# Patient Record
Sex: Female | Born: 1975 | Race: White | Hispanic: No | Marital: Married | Smoking: Never smoker
Health system: Southern US, Community
[De-identification: ages and names within clinical notes are randomized; demographics above are authoritative.]

## PROBLEM LIST (undated history)

## (undated) DIAGNOSIS — Z789 Other specified health status: Secondary | ICD-10-CM

## (undated) DIAGNOSIS — R87619 Unspecified abnormal cytological findings in specimens from cervix uteri: Secondary | ICD-10-CM

## (undated) DIAGNOSIS — D649 Anemia, unspecified: Secondary | ICD-10-CM

## (undated) HISTORY — DX: Unspecified abnormal cytological findings in specimens from cervix uteri: R87.619

## (undated) HISTORY — DX: Anemia, unspecified: D64.9

---

## 1999-05-18 ENCOUNTER — Other Ambulatory Visit: Admission: RE | Admit: 1999-05-18 | Discharge: 1999-05-18 | Payer: Self-pay | Admitting: Obstetrics and Gynecology

## 2000-05-09 ENCOUNTER — Other Ambulatory Visit: Admission: RE | Admit: 2000-05-09 | Discharge: 2000-05-09 | Payer: Self-pay | Admitting: Obstetrics and Gynecology

## 2001-04-10 ENCOUNTER — Other Ambulatory Visit: Admission: RE | Admit: 2001-04-10 | Discharge: 2001-04-10 | Payer: Self-pay | Admitting: Obstetrics and Gynecology

## 2001-11-26 ENCOUNTER — Other Ambulatory Visit: Admission: RE | Admit: 2001-11-26 | Discharge: 2001-11-26 | Payer: Self-pay | Admitting: *Deleted

## 2002-05-23 ENCOUNTER — Other Ambulatory Visit: Admission: RE | Admit: 2002-05-23 | Discharge: 2002-05-23 | Payer: Self-pay | Admitting: Unknown Physician Specialty

## 2002-08-28 ENCOUNTER — Encounter: Payer: Self-pay | Admitting: Gastroenterology

## 2002-08-28 ENCOUNTER — Encounter: Admission: RE | Admit: 2002-08-28 | Discharge: 2002-08-28 | Payer: Self-pay | Admitting: Gastroenterology

## 2003-03-12 ENCOUNTER — Other Ambulatory Visit: Admission: RE | Admit: 2003-03-12 | Discharge: 2003-03-12 | Payer: Self-pay | Admitting: Obstetrics and Gynecology

## 2003-09-04 ENCOUNTER — Other Ambulatory Visit: Admission: RE | Admit: 2003-09-04 | Discharge: 2003-09-04 | Payer: Self-pay | Admitting: *Deleted

## 2003-09-04 ENCOUNTER — Other Ambulatory Visit: Admission: RE | Admit: 2003-09-04 | Discharge: 2003-09-04 | Payer: Self-pay | Admitting: Obstetrics and Gynecology

## 2004-09-27 ENCOUNTER — Other Ambulatory Visit: Admission: RE | Admit: 2004-09-27 | Discharge: 2004-09-27 | Payer: Self-pay | Admitting: Obstetrics and Gynecology

## 2006-02-21 ENCOUNTER — Other Ambulatory Visit: Admission: RE | Admit: 2006-02-21 | Discharge: 2006-02-21 | Payer: Self-pay | Admitting: Obstetrics and Gynecology

## 2007-04-06 ENCOUNTER — Other Ambulatory Visit: Admission: RE | Admit: 2007-04-06 | Discharge: 2007-04-06 | Payer: Self-pay | Admitting: Obstetrics & Gynecology

## 2007-04-06 ENCOUNTER — Other Ambulatory Visit: Admission: RE | Admit: 2007-04-06 | Discharge: 2007-04-06 | Payer: Self-pay | Admitting: Obstetrics and Gynecology

## 2007-08-03 ENCOUNTER — Other Ambulatory Visit: Admission: RE | Admit: 2007-08-03 | Discharge: 2007-08-03 | Payer: Self-pay | Admitting: Obstetrics and Gynecology

## 2007-12-28 ENCOUNTER — Other Ambulatory Visit: Admission: RE | Admit: 2007-12-28 | Discharge: 2007-12-28 | Payer: Self-pay | Admitting: Obstetrics and Gynecology

## 2008-04-23 ENCOUNTER — Other Ambulatory Visit: Admission: RE | Admit: 2008-04-23 | Discharge: 2008-04-23 | Payer: Self-pay | Admitting: Obstetrics and Gynecology

## 2009-09-12 ENCOUNTER — Emergency Department (HOSPITAL_COMMUNITY): Admission: EM | Admit: 2009-09-12 | Discharge: 2009-09-12 | Payer: Self-pay | Admitting: Family Medicine

## 2010-11-05 ENCOUNTER — Inpatient Hospital Stay (HOSPITAL_COMMUNITY): Admission: AD | Admit: 2010-11-05 | Payer: Self-pay | Admitting: Obstetrics and Gynecology

## 2010-11-08 ENCOUNTER — Inpatient Hospital Stay (HOSPITAL_COMMUNITY)
Admission: AD | Admit: 2010-11-08 | Discharge: 2010-11-11 | DRG: 373 | Disposition: A | Discharge status: Home / Self Care | Payer: BC Managed Care – PPO | Source: Ambulatory Visit | Attending: Obstetrics and Gynecology | Admitting: Obstetrics and Gynecology

## 2010-11-08 LAB — CBC
HCT: 33.1 % — ABNORMAL LOW (ref 36.0–46.0)
Hemoglobin: 11.2 g/dL — ABNORMAL LOW (ref 12.0–15.0)
MCH: 30.2 pg (ref 26.0–34.0)
MCHC: 33.8 g/dL (ref 30.0–36.0)
MCV: 89.2 fL (ref 78.0–100.0)
Platelets: 200 10*3/uL (ref 150–400)
RBC: 3.71 MIL/uL — ABNORMAL LOW (ref 3.87–5.11)
RDW: 12.7 % (ref 11.5–15.5)
WBC: 7.2 10*3/uL (ref 4.0–10.5)

## 2010-11-09 LAB — RPR: RPR Ser Ql: NONREACTIVE

## 2010-11-10 LAB — CBC
HCT: 26.6 % — ABNORMAL LOW (ref 36.0–46.0)
Hemoglobin: 8.9 g/dL — ABNORMAL LOW (ref 12.0–15.0)
MCH: 30 pg (ref 26.0–34.0)
MCHC: 33.5 g/dL (ref 30.0–36.0)
MCV: 89.6 fL (ref 78.0–100.0)
Platelets: 155 10*3/uL (ref 150–400)
RBC: 2.97 MIL/uL — ABNORMAL LOW (ref 3.87–5.11)
RDW: 12.7 % (ref 11.5–15.5)
WBC: 9.3 10*3/uL (ref 4.0–10.5)

## 2012-01-05 LAB — OB RESULTS CONSOLE HIV ANTIBODY (ROUTINE TESTING): HIV: NONREACTIVE

## 2012-01-05 LAB — OB RESULTS CONSOLE RPR: RPR: NONREACTIVE

## 2012-01-17 LAB — OB RESULTS CONSOLE GC/CHLAMYDIA
Chlamydia: NEGATIVE
Gonorrhea: NEGATIVE

## 2012-07-10 LAB — OB RESULTS CONSOLE GBS: GBS: NEGATIVE

## 2012-08-06 ENCOUNTER — Encounter (HOSPITAL_COMMUNITY): Payer: Self-pay | Admitting: *Deleted

## 2012-08-06 ENCOUNTER — Inpatient Hospital Stay (HOSPITAL_COMMUNITY)
Admission: AD | Admit: 2012-08-06 | Discharge: 2012-08-09 | DRG: 371 | Disposition: A | Discharge status: Home / Self Care | Payer: BC Managed Care – PPO | Source: Ambulatory Visit | Attending: Obstetrics and Gynecology | Admitting: Obstetrics and Gynecology

## 2012-08-06 DIAGNOSIS — O09529 Supervision of elderly multigravida, unspecified trimester: Secondary | ICD-10-CM | POA: Diagnosis present

## 2012-08-06 DIAGNOSIS — Z98891 History of uterine scar from previous surgery: Secondary | ICD-10-CM

## 2012-08-06 HISTORY — DX: Other specified health status: Z78.9

## 2012-08-06 LAB — CBC
HCT: 35.5 % — ABNORMAL LOW (ref 36.0–46.0)
Hemoglobin: 12.3 g/dL (ref 12.0–15.0)
WBC: 9.5 10*3/uL (ref 4.0–10.5)

## 2012-08-06 MED ORDER — FENTANYL 2.5 MCG/ML BUPIVACAINE 1/10 % EPIDURAL INFUSION (WH - ANES)
14.0000 mL/h | INTRAMUSCULAR | Status: DC
  Administered 2012-08-07: 14 mL/h via EPIDURAL
  Filled 2012-08-06: qty 125

## 2012-08-06 MED ORDER — LACTATED RINGERS IV SOLN
INTRAVENOUS | Status: DC
  Administered 2012-08-06: via INTRAVENOUS

## 2012-08-06 MED ORDER — ACETAMINOPHEN 325 MG PO TABS: 650.0000 mg | ORAL_TABLET | ORAL | Status: DC | PRN

## 2012-08-06 MED ORDER — OXYTOCIN 40 UNITS IN LACTATED RINGERS INFUSION - SIMPLE MED: 62.5000 mL/h | INTRAVENOUS | Status: DC

## 2012-08-06 MED ORDER — FLEET ENEMA 7-19 GM/118ML RE ENEM: 1.0000 | ENEMA | RECTAL | Status: DC | PRN

## 2012-08-06 MED ORDER — OXYCODONE-ACETAMINOPHEN 5-325 MG PO TABS: 1.0000 | ORAL_TABLET | ORAL | Status: DC | PRN

## 2012-08-06 MED ORDER — OXYTOCIN BOLUS FROM INFUSION: 500.0000 mL | INTRAVENOUS | Status: DC

## 2012-08-06 MED ORDER — ONDANSETRON HCL 4 MG/2ML IJ SOLN: 4.0000 mg | Freq: Four times a day (QID) | INTRAMUSCULAR | Status: DC | PRN

## 2012-08-06 MED ORDER — EPHEDRINE 5 MG/ML INJ: 10.0000 mg | INTRAVENOUS | Status: DC | PRN

## 2012-08-06 MED ORDER — LACTATED RINGERS IV SOLN: 500.0000 mL | Freq: Once | INTRAVENOUS | Status: DC

## 2012-08-06 MED ORDER — IBUPROFEN 600 MG PO TABS: 600.0000 mg | ORAL_TABLET | Freq: Four times a day (QID) | ORAL | Status: DC | PRN

## 2012-08-06 MED ORDER — PHENYLEPHRINE 40 MCG/ML (10ML) SYRINGE FOR IV PUSH (FOR BLOOD PRESSURE SUPPORT): 80.0000 ug | PREFILLED_SYRINGE | INTRAVENOUS | Status: DC | PRN

## 2012-08-06 MED ORDER — CITRIC ACID-SODIUM CITRATE 334-500 MG/5ML PO SOLN
30.0000 mL | ORAL | Status: DC | PRN
  Filled 2012-08-06: qty 15

## 2012-08-06 MED ORDER — DIPHENHYDRAMINE HCL 50 MG/ML IJ SOLN: 12.5000 mg | INTRAMUSCULAR | Status: DC | PRN

## 2012-08-06 MED ORDER — LACTATED RINGERS IV SOLN
500.0000 mL | INTRAVENOUS | Status: DC | PRN
  Administered 2012-08-06: 500 mL via INTRAVENOUS

## 2012-08-06 MED ORDER — TERBUTALINE SULFATE 1 MG/ML IJ SOLN: 0.2500 mg | Freq: Once | INTRAMUSCULAR | Status: AC | PRN

## 2012-08-06 MED ORDER — LIDOCAINE HCL (PF) 1 % IJ SOLN
30.0000 mL | INTRAMUSCULAR | Status: DC | PRN
  Filled 2012-08-06: qty 30

## 2012-08-06 MED ORDER — OXYTOCIN 40 UNITS IN LACTATED RINGERS INFUSION - SIMPLE MED
1.0000 m[IU]/min | INTRAVENOUS | Status: DC
  Administered 2012-08-07: 2 m[IU]/min via INTRAVENOUS
  Filled 2012-08-06: qty 1000

## 2012-08-06 NOTE — Anesthesia Procedure Notes (Signed)
Epidural Patient location during procedure: OB Start time: 08/06/2012 11:50 PM  Staffing Performed by: anesthesiologist   Preanesthetic Checklist Completed: patient identified, site marked, surgical consent, pre-op evaluation, timeout performed, IV checked, risks and benefits discussed and monitors and equipment checked  Epidural Patient position: sitting Prep: site prepped and draped and DuraPrep Patient monitoring: continuous pulse ox and blood pressure Approach: midline Injection technique: LOR air  Needle:  Needle type: Tuohy  Needle gauge: 17 G Needle length: 9 cm and 9 Needle insertion depth: 5 cm cm Catheter type: closed end flexible Catheter size: 19 Gauge Catheter at skin depth: 10 cm Test dose: negative  Assessment Events: blood not aspirated, injection not painful, no injection resistance, negative IV test and no paresthesia  Additional Notes Discussed risk of headache, infection, bleeding, nerve injury and failed or incomplete block.  Patient voices understanding and wishes to proceed.  Epidural placed easily on first attempt.  Patient tolerated procedure well with no apparent complications.  Jasmine December, MD Reason for block:procedure for pain

## 2012-08-06 NOTE — Progress Notes (Signed)
Strong fetal movement palpated and seen. Abdomen shape changed dramatically.  Pt turned side to side.

## 2012-08-06 NOTE — Anesthesia Preprocedure Evaluation (Addendum)
Anesthesia Evaluation  Patient identified by MRN, date of birth, ID band Patient awake    Reviewed: Allergy & Precautions, H&P , NPO status , Patient's Chart, lab work & pertinent test results, reviewed documented beta blocker date and time   History of Anesthesia Complications Negative for: history of anesthetic complications  Airway Mallampati: I TM Distance: >3 FB Neck ROM: full    Dental  (+) Teeth Intact   Pulmonary neg pulmonary ROS,  breath sounds clear to auscultation        Cardiovascular negative cardio ROS  Rhythm:regular Rate:Normal     Neuro/Psych negative neurological ROS  negative psych ROS   GI/Hepatic negative GI ROS, Neg liver ROS,   Endo/Other  negative endocrine ROS  Renal/GU negative Renal ROS     Musculoskeletal   Abdominal   Peds  Hematology negative hematology ROS (+)   Anesthesia Other Findings   Reproductive/Obstetrics (+) Pregnancy (fetal bradycardia)                          Anesthesia Physical Anesthesia Plan  ASA: II and emergent  Anesthesia Plan: Epidural   Post-op Pain Management:    Induction:   Airway Management Planned:   Additional Equipment:   Intra-op Plan:   Post-operative Plan:   Informed Consent: I have reviewed the patients History and Physical, chart, labs and discussed the procedure including the risks, benefits and alternatives for the proposed anesthesia with the patient or authorized representative who has indicated his/her understanding and acceptance.     Plan Discussed with:   Anesthesia Plan Comments:        Anesthesia Quick Evaluation

## 2012-08-06 NOTE — Progress Notes (Signed)
EFM off for pt to walk.

## 2012-08-06 NOTE — Progress Notes (Signed)
Patient ID: Stacey Dodson, female   DOB: 1976/06/05, 36 y.o.   MRN: [id number] Cervix  4 cm 50 % vtx -1 Increasing ctx's  fhr reactive no decels Declines pitocin at this point will follow

## 2012-08-06 NOTE — H&P (Signed)
Stacey Dodson is a 36 y.o. female presenting at 68.6 with spontaneous rupture of membranes.  Negative GBS Maternal Medical History:  Reason for admission: Reason for admission: rupture of membranes.  Contractions: Onset was 1-2 hours ago.   Frequency: regular.   Perceived severity is moderate.    Fetal activity: Perceived fetal activity is normal.    Prenatal complications: no prenatal complications Prenatal Complications - Diabetes: none.    OB History    No data available     No past medical history on file. No past surgical history on file. Family History: family history is not on file. Social History:  does not have a smoking history on file. She does not have any smokeless tobacco history on file. Her alcohol and drug histories not on file.   Prenatal Transfer Tool  Maternal Diabetes: No Genetic Screening: Normal Maternal Ultrasounds/Referrals: Normal Fetal Ultrasounds or other Referrals:  None Maternal Substance Abuse:  No Significant Maternal Medications:  None Significant Maternal Lab Results:  None Other Comments:  None  ROS    There were no vitals taken for this visit. Maternal Exam:  Uterine Assessment: Contraction strength is moderate.  Contraction frequency is regular.   Abdomen: Patient reports no abdominal tenderness. Fundal height is c/w dates.   Estimated fetal weight is 7.5.   Fetal presentation: vertex  Cervix: Cervix 2-3 cm and 50 % effaced  vtx -2   Physical Exam  Prenatal labs: ABO, Rh:   Antibody:   Rubella:   RPR:    HBsAg:    HIV:    GBS:     Assessment/Plan: IUP at term with SROM Routine Labor and Delivery   Jazion Atteberry S 08/06/2012, 12:47 PM

## 2012-08-07 ENCOUNTER — Inpatient Hospital Stay (HOSPITAL_COMMUNITY): Payer: BC Managed Care – PPO | Admitting: Anesthesiology

## 2012-08-07 ENCOUNTER — Encounter (HOSPITAL_COMMUNITY)
Admission: AD | Disposition: A | Discharge status: Home / Self Care | Payer: Self-pay | Source: Ambulatory Visit | Attending: Obstetrics and Gynecology

## 2012-08-07 ENCOUNTER — Inpatient Hospital Stay (HOSPITAL_COMMUNITY): Payer: BC Managed Care – PPO

## 2012-08-07 ENCOUNTER — Encounter (HOSPITAL_COMMUNITY): Payer: Self-pay | Admitting: *Deleted

## 2012-08-07 ENCOUNTER — Encounter (HOSPITAL_COMMUNITY): Payer: Self-pay | Admitting: Anesthesiology

## 2012-08-07 SURGERY — Surgical Case
Anesthesia: Epidural | Site: Abdomen | Wound class: Clean Contaminated

## 2012-08-07 MED ORDER — EPHEDRINE SULFATE 50 MG/ML IJ SOLN
INTRAMUSCULAR | Status: DC | PRN
  Administered 2012-08-07: 15 mg via INTRAVENOUS
  Administered 2012-08-07 (×2): 10 mg via INTRAVENOUS

## 2012-08-07 MED ORDER — NALBUPHINE HCL 10 MG/ML IJ SOLN
5.0000 mg | INTRAMUSCULAR | Status: DC | PRN
  Filled 2012-08-07: qty 1

## 2012-08-07 MED ORDER — SODIUM CHLORIDE 0.9 % IJ SOLN: 3.0000 mL | INTRAMUSCULAR | Status: DC | PRN

## 2012-08-07 MED ORDER — LIDOCAINE-EPINEPHRINE (PF) 2 %-1:200000 IJ SOLN
INTRAMUSCULAR | Status: AC
  Filled 2012-08-07: qty 20

## 2012-08-07 MED ORDER — WITCH HAZEL-GLYCERIN EX PADS: 1.0000 "application " | MEDICATED_PAD | CUTANEOUS | Status: DC | PRN

## 2012-08-07 MED ORDER — LACTATED RINGERS IV SOLN: INTRAVENOUS | Status: DC

## 2012-08-07 MED ORDER — MORPHINE SULFATE (PF) 0.5 MG/ML IJ SOLN
INTRAMUSCULAR | Status: DC | PRN
  Administered 2012-08-07: 1 mg via INTRAVENOUS

## 2012-08-07 MED ORDER — ONDANSETRON HCL 4 MG/2ML IJ SOLN: 4.0000 mg | INTRAMUSCULAR | Status: DC | PRN

## 2012-08-07 MED ORDER — PRENATAL MULTIVITAMIN CH
1.0000 | ORAL_TABLET | Freq: Every day | ORAL | Status: DC
  Administered 2012-08-07 – 2012-08-09 (×3): 1 via ORAL
  Filled 2012-08-07 (×3): qty 1

## 2012-08-07 MED ORDER — DIPHENHYDRAMINE HCL 25 MG PO CAPS: 25.0000 mg | ORAL_CAPSULE | Freq: Four times a day (QID) | ORAL | Status: DC | PRN

## 2012-08-07 MED ORDER — OXYTOCIN 10 UNIT/ML IJ SOLN
40.0000 [IU] | INTRAVENOUS | Status: DC | PRN
  Administered 2012-08-07: 40 [IU] via INTRAVENOUS

## 2012-08-07 MED ORDER — DEXAMETHASONE SODIUM PHOSPHATE 10 MG/ML IJ SOLN
INTRAMUSCULAR | Status: AC
  Filled 2012-08-07: qty 1

## 2012-08-07 MED ORDER — MENTHOL 3 MG MT LOZG: 1.0000 | LOZENGE | OROMUCOSAL | Status: DC | PRN

## 2012-08-07 MED ORDER — SENNOSIDES-DOCUSATE SODIUM 8.6-50 MG PO TABS
2.0000 | ORAL_TABLET | Freq: Every day | ORAL | Status: DC
  Administered 2012-08-07 – 2012-08-08 (×2): 2 via ORAL

## 2012-08-07 MED ORDER — EPHEDRINE 5 MG/ML INJ
INTRAVENOUS | Status: AC
  Filled 2012-08-07: qty 10

## 2012-08-07 MED ORDER — DEXAMETHASONE SODIUM PHOSPHATE 10 MG/ML IJ SOLN
INTRAMUSCULAR | Status: DC | PRN
  Administered 2012-08-07: 10 mg via INTRAVENOUS

## 2012-08-07 MED ORDER — MORPHINE SULFATE (PF) 0.5 MG/ML IJ SOLN
INTRAMUSCULAR | Status: DC | PRN
  Administered 2012-08-07: 4 mg via EPIDURAL

## 2012-08-07 MED ORDER — SODIUM BICARBONATE 8.4 % IV SOLN
INTRAVENOUS | Status: DC | PRN
  Administered 2012-08-07 (×2): 5 mL via EPIDURAL

## 2012-08-07 MED ORDER — SODIUM CHLORIDE 0.9 % IV SOLN: 250.0000 mL | INTRAVENOUS | Status: DC

## 2012-08-07 MED ORDER — SIMETHICONE 80 MG PO CHEW: 80.0000 mg | CHEWABLE_TABLET | ORAL | Status: DC | PRN

## 2012-08-07 MED ORDER — MORPHINE SULFATE 0.5 MG/ML IJ SOLN
INTRAMUSCULAR | Status: AC
  Filled 2012-08-07: qty 10

## 2012-08-07 MED ORDER — TETANUS-DIPHTH-ACELL PERTUSSIS 5-2.5-18.5 LF-MCG/0.5 IM SUSP: 0.5000 mL | Freq: Once | INTRAMUSCULAR | Status: DC

## 2012-08-07 MED ORDER — ONDANSETRON HCL 4 MG PO TABS: 4.0000 mg | ORAL_TABLET | ORAL | Status: DC | PRN

## 2012-08-07 MED ORDER — KETOROLAC TROMETHAMINE 60 MG/2ML IM SOLN
60.0000 mg | Freq: Once | INTRAMUSCULAR | Status: AC | PRN
  Administered 2012-08-07: 60 mg via INTRAMUSCULAR

## 2012-08-07 MED ORDER — OXYCODONE-ACETAMINOPHEN 5-325 MG PO TABS: 1.0000 | ORAL_TABLET | ORAL | Status: DC | PRN

## 2012-08-07 MED ORDER — ONDANSETRON HCL 4 MG/2ML IJ SOLN
INTRAMUSCULAR | Status: DC | PRN
  Administered 2012-08-07: 4 mg via INTRAVENOUS

## 2012-08-07 MED ORDER — ONDANSETRON HCL 4 MG/2ML IJ SOLN
INTRAMUSCULAR | Status: AC
  Filled 2012-08-07: qty 2

## 2012-08-07 MED ORDER — DIPHENHYDRAMINE HCL 25 MG PO CAPS: 25.0000 mg | ORAL_CAPSULE | ORAL | Status: DC | PRN

## 2012-08-07 MED ORDER — MEPERIDINE HCL 25 MG/ML IJ SOLN: 6.2500 mg | INTRAMUSCULAR | Status: DC | PRN

## 2012-08-07 MED ORDER — IBUPROFEN 600 MG PO TABS
600.0000 mg | ORAL_TABLET | Freq: Four times a day (QID) | ORAL | Status: DC | PRN
  Filled 2012-08-07 (×6): qty 1

## 2012-08-07 MED ORDER — SODIUM BICARBONATE 8.4 % IV SOLN
INTRAVENOUS | Status: AC
  Filled 2012-08-07: qty 50

## 2012-08-07 MED ORDER — ZOLPIDEM TARTRATE 5 MG PO TABS: 5.0000 mg | ORAL_TABLET | Freq: Every evening | ORAL | Status: DC | PRN

## 2012-08-07 MED ORDER — OXYTOCIN 10 UNIT/ML IJ SOLN
INTRAMUSCULAR | Status: AC
  Filled 2012-08-07: qty 4

## 2012-08-07 MED ORDER — BISACODYL 10 MG RE SUPP: 10.0000 mg | Freq: Every day | RECTAL | Status: DC | PRN

## 2012-08-07 MED ORDER — METOCLOPRAMIDE HCL 5 MG/ML IJ SOLN: 10.0000 mg | Freq: Three times a day (TID) | INTRAMUSCULAR | Status: DC | PRN

## 2012-08-07 MED ORDER — ONDANSETRON HCL 4 MG/2ML IJ SOLN: 4.0000 mg | Freq: Three times a day (TID) | INTRAMUSCULAR | Status: DC | PRN

## 2012-08-07 MED ORDER — LIDOCAINE HCL (PF) 1 % IJ SOLN
INTRAMUSCULAR | Status: DC | PRN
  Administered 2012-08-06 – 2012-08-07 (×4): 4 mL

## 2012-08-07 MED ORDER — DIPHENHYDRAMINE HCL 50 MG/ML IJ SOLN: 12.5000 mg | INTRAMUSCULAR | Status: DC | PRN

## 2012-08-07 MED ORDER — NALOXONE HCL 1 MG/ML IJ SOLN: 1.0000 ug/kg/h | INTRAVENOUS | Status: DC | PRN

## 2012-08-07 MED ORDER — KETOROLAC TROMETHAMINE 60 MG/2ML IM SOLN
INTRAMUSCULAR | Status: AC
  Filled 2012-08-07: qty 2

## 2012-08-07 MED ORDER — DIBUCAINE 1 % RE OINT: 1.0000 "application " | TOPICAL_OINTMENT | RECTAL | Status: DC | PRN

## 2012-08-07 MED ORDER — CLINDAMYCIN PHOSPHATE 900 MG/50ML IV SOLN
900.0000 mg | Freq: Once | INTRAVENOUS | Status: AC
  Administered 2012-08-07: 900 mg via INTRAVENOUS
  Filled 2012-08-07: qty 50

## 2012-08-07 MED ORDER — OXYTOCIN 40 UNITS IN LACTATED RINGERS INFUSION - SIMPLE MED: 62.5000 mL/h | INTRAVENOUS | Status: AC

## 2012-08-07 MED ORDER — LACTATED RINGERS IV SOLN
INTRAVENOUS | Status: DC | PRN
  Administered 2012-08-07: 03:00:00 via INTRAVENOUS

## 2012-08-07 MED ORDER — DIPHENHYDRAMINE HCL 50 MG/ML IJ SOLN: 25.0000 mg | INTRAMUSCULAR | Status: DC | PRN

## 2012-08-07 MED ORDER — PHENYLEPHRINE 40 MCG/ML (10ML) SYRINGE FOR IV PUSH (FOR BLOOD PRESSURE SUPPORT)
PREFILLED_SYRINGE | INTRAVENOUS | Status: AC
  Filled 2012-08-07: qty 5

## 2012-08-07 MED ORDER — SCOPOLAMINE 1 MG/3DAYS TD PT72
1.0000 | MEDICATED_PATCH | Freq: Once | TRANSDERMAL | Status: DC
  Administered 2012-08-07: 1.5 mg via TRANSDERMAL

## 2012-08-07 MED ORDER — FENTANYL CITRATE 0.05 MG/ML IJ SOLN: 25.0000 ug | INTRAMUSCULAR | Status: DC | PRN

## 2012-08-07 MED ORDER — LANOLIN HYDROUS EX OINT: 1.0000 "application " | TOPICAL_OINTMENT | CUTANEOUS | Status: DC | PRN

## 2012-08-07 MED ORDER — NALOXONE HCL 0.4 MG/ML IJ SOLN: 0.4000 mg | INTRAMUSCULAR | Status: DC | PRN

## 2012-08-07 MED ORDER — 0.9 % SODIUM CHLORIDE (POUR BTL) OPTIME
TOPICAL | Status: DC | PRN
  Administered 2012-08-07: 500 mL

## 2012-08-07 MED ORDER — SODIUM CHLORIDE 0.9 % IJ SOLN: 3.0000 mL | Freq: Two times a day (BID) | INTRAMUSCULAR | Status: DC

## 2012-08-07 MED ORDER — LACTATED RINGERS IV SOLN
INTRAVENOUS | Status: DC | PRN
  Administered 2012-08-07 (×3): via INTRAVENOUS

## 2012-08-07 MED ORDER — KETOROLAC TROMETHAMINE 30 MG/ML IJ SOLN: 30.0000 mg | Freq: Four times a day (QID) | INTRAMUSCULAR | Status: AC | PRN

## 2012-08-07 MED ORDER — FLEET ENEMA 7-19 GM/118ML RE ENEM: 1.0000 | ENEMA | Freq: Every day | RECTAL | Status: DC | PRN

## 2012-08-07 MED ORDER — SCOPOLAMINE 1 MG/3DAYS TD PT72
MEDICATED_PATCH | TRANSDERMAL | Status: AC
  Filled 2012-08-07: qty 1

## 2012-08-07 MED ORDER — PHENYLEPHRINE HCL 10 MG/ML IJ SOLN
INTRAMUSCULAR | Status: DC | PRN
  Administered 2012-08-07: 40 ug via INTRAVENOUS
  Administered 2012-08-07 (×4): 80 ug via INTRAVENOUS

## 2012-08-07 MED ORDER — SIMETHICONE 80 MG PO CHEW
80.0000 mg | CHEWABLE_TABLET | Freq: Three times a day (TID) | ORAL | Status: DC
  Administered 2012-08-07 – 2012-08-09 (×7): 80 mg via ORAL

## 2012-08-07 MED ORDER — IBUPROFEN 600 MG PO TABS
600.0000 mg | ORAL_TABLET | Freq: Four times a day (QID) | ORAL | Status: DC
  Administered 2012-08-07 – 2012-08-09 (×8): 600 mg via ORAL
  Filled 2012-08-07 (×2): qty 1

## 2012-08-07 SURGICAL SUPPLY — 32 items
CLOTH BEACON ORANGE TIMEOUT ST (SAFETY) ×2 IMPLANT
DRAPE LG THREE QUARTER DISP (DRAPES) ×2 IMPLANT
DRESSING TELFA 8X3 (GAUZE/BANDAGES/DRESSINGS) IMPLANT
DRSG OPSITE POSTOP 4X10 (GAUZE/BANDAGES/DRESSINGS) ×2 IMPLANT
DURAPREP 26ML APPLICATOR (WOUND CARE) ×1 IMPLANT
ELECT REM PT RETURN 9FT ADLT (ELECTROSURGICAL) ×2
ELECTRODE REM PT RTRN 9FT ADLT (ELECTROSURGICAL) ×1 IMPLANT
EXTRACTOR VACUUM M CUP 4 TUBE (SUCTIONS) IMPLANT
GAUZE SPONGE 4X4 12PLY STRL LF (GAUZE/BANDAGES/DRESSINGS) ×3 IMPLANT
GLOVE BIO SURGEON STRL SZ7 (GLOVE) ×5 IMPLANT
GOWN STRL REIN XL XLG (GOWN DISPOSABLE) ×4 IMPLANT
KIT ABG SYR 3ML LUER SLIP (SYRINGE) ×2 IMPLANT
NDL HYPO 25X5/8 SAFETYGLIDE (NEEDLE) ×1 IMPLANT
NEEDLE HYPO 25X5/8 SAFETYGLIDE (NEEDLE) ×2 IMPLANT
NS IRRIG 1000ML POUR BTL (IV SOLUTION) ×2 IMPLANT
PACK C SECTION WH (CUSTOM PROCEDURE TRAY) ×2 IMPLANT
PAD ABD 7.5X8 STRL (GAUZE/BANDAGES/DRESSINGS) IMPLANT
PAD OB MATERNITY 4.3X12.25 (PERSONAL CARE ITEMS) ×1 IMPLANT
SLEEVE SCD COMPRESS KNEE MED (MISCELLANEOUS) ×2 IMPLANT
STAPLER VISISTAT 35W (STAPLE) ×2 IMPLANT
STRIP CLOSURE SKIN 1/4X4 (GAUZE/BANDAGES/DRESSINGS) IMPLANT
SUT CHROMIC 0 CT 1 (SUTURE) IMPLANT
SUT CHROMIC 0 CTX 36 (SUTURE) ×4 IMPLANT
SUT CHROMIC 2 0 SH (SUTURE) IMPLANT
SUT PDS AB 0 CT 36 (SUTURE) ×2 IMPLANT
SUT PLAIN 0 NONE (SUTURE) IMPLANT
SUT PLAIN 2 0 XLH (SUTURE) IMPLANT
SUT VIC AB 3-0 CT1 27 (SUTURE) ×2
SUT VIC AB 3-0 CT1 TAPERPNT 27 (SUTURE) ×1 IMPLANT
TOWEL OR 17X24 6PK STRL BLUE (TOWEL DISPOSABLE) ×6 IMPLANT
TRAY FOLEY CATH 14FR (SET/KITS/TRAYS/PACK) ×2 IMPLANT
WATER STERILE IRR 1000ML POUR (IV SOLUTION) ×2 IMPLANT

## 2012-08-07 NOTE — Brief Op Note (Signed)
08/06/2012 - 08/07/2012  3:47 AM  PATIENT:  Stacey Dodson  36 y.o. female  PRE-OPERATIVE DIAGNOSIS:  fetal bradycardia  POST-OPERATIVE DIAGNOSIS:  fetal bradycardia  PROCEDURE:  Procedure(s) (LRB) with comments: CESAREAN SECTION (N/A) - Primary cesarean section of baby  boy at 5  APGAR 6/9  SURGEON:  Surgeon(s) and Role:    * Juluis Mire, MD - Primary  PHYSICIAN ASSISTANT:   ASSISTANTS: none   ANESTHESIA:   epidural  EBL:  Total I/O In: -  Out: 950 [Urine:450; Blood:500]  BLOOD ADMINISTERED:none  DRAINS: Urinary Catheter (Foley)   LOCAL MEDICATIONS USED:  NONE  SPECIMEN:  Source of Specimen:  placenta  DISPOSITION OF SPECIMEN:  PATHOLOGY  COUNTS:  NO no count due to stat cesarean section x-ray will be obtained  TOURNIQUET:  * No tourniquets in log *  DICTATION: .Other Dictation: Dictation Number [id number]  PLAN OF CARE: Admit to inpatient   PATIENT DISPOSITION:  PACU - hemodynamically stable.   Delay start of Pharmacological VTE agent (>24hrs) due to surgical blood loss or risk of bleeding: no

## 2012-08-07 NOTE — Addendum Note (Signed)
Addendum  created 08/07/12 1041 by Suella Grove, CRNA   Modules edited:Notes Section

## 2012-08-07 NOTE — Consult Note (Signed)
Neonatology Note:   Attendance at C-section:    I was asked to attend this Stat primary C/S at term due to fetal bradycardia. The mother is a G3P1A1 A pos, GBS neg with an otherwise uncomplicated pregnancy. ROM 16 hours prior to delivery, fluid clear. Infant floppy at birth but with HR > 100 and fair color. We bulb suctioned and gave vigorous stimulation and he breathed, somewhat irregularly at first, but sufficiently to maintain HR and produce improvement in perfusion and color. We continued to provide some stimulation and respirations were regular by 1.5 minutes. Tone improved gradually and was normal by 5 minutes of life. Perfusion was excellent and he appeared fully recovered at 5 minutes, so that he was allowed to stay with his parents for skin to skin time. Ap 6/9. Lungs clear to ausc in DR. To CN to care of Pediatrician. Cord pH was 7/08.   Deatra James, MD

## 2012-08-07 NOTE — Transfer of Care (Signed)
Immediate Anesthesia Transfer of Care Note  Patient: Stacey Dodson  Procedure(s) Performed: Procedure(s) (LRB) with comments: CESAREAN SECTION (N/A) - Primary cesarean section of baby  boy at 75  APGAR 6/9  Patient Location: PACU  Anesthesia Type:Epidural  Level of Consciousness: awake  Airway & Oxygen Therapy: Patient Spontanous Breathing  Post-op Assessment: Report given to PACU RN and Post -op Vital signs reviewed and stable  Post vital signs: stable  Complications: No apparent anesthesia complications

## 2012-08-07 NOTE — Op Note (Signed)
NAME:  Stacey Dodson, Stacey Dodson                  ACCOUNT NO.:  [id number]  MEDICAL RECORD NO.:  [id number]  LOCATION:                                 FACILITY:  PHYSICIAN:  Juluis Mire, M.D.        DATE OF BIRTH:  DATE OF PROCEDURE:  08/07/2012 DATE OF DISCHARGE:                              OPERATIVE REPORT   PREOPERATIVE DIAGNOSES:  Intrauterine pregnancy at 40 weeks.  Fetal bradycardia.  POSTOPERATIVE DIAGNOSES:  Intrauterine pregnancy at 40 weeks. Fetal bradycardia.  OPERATIVE PROCEDURE:  Low transverse cesarean section.  SURGEON:  Juluis Mire, MD  ANESTHESIA:  Epidural.  ESTIMATED BLOOD LOSS:  500 mL.  PACKS:  None.  DRAINS:  Included urethral Foley.  INTRAOPERATIVE BLOOD PLACED:  None.  COMPLICATIONS:  None.  INDICATIONS:  The patient is a 36 year old gravida 2, para 1 at 40 weeks had spontaneous rupture of membranes in the office.  Was sent to labor and delivery for further management.  She progressed slowly.  Eventually an epidural and Pitocin was begun.  She got to about 8 cm.  She had an episode of fetal bradycardia down to the 60s.  It slowly responded returned to approximately the 90s.  We were doing position changes, O2, and hydration.  The Pitocin had been discontinued.  At this point in time recheck revealed just a rim of cervix.  We were able to reduce that with the next contraction.  As she was pushing, however, we ended up with more fetal bradycardia into the 70s that did not really respond well.  The fetal vertex was not descended rapidly.  The decision was to proceed with cesarean section due to fetal bradycardia.  The risks were explained including the risk of infection.  Risk of hemorrhage that could require transfusion with the risk of AIDS or hepatitis.  Excessive bleeding could require hysterectomy, risk of injury to adjacent organs such as bladder, bowel, ureters that could require further exploratory surgery.  Risk of deep venous thrombosis and  pulmonary embolus.  The patient expressed understanding of indications and risks.  PROCEDURE IN DETAIL:  The patient was taken to the OR and placed in supine position with left lateral tilt.  She already had a satisfactory level of epidural anesthesia.  The abdomen was prepped out with Betadine and draped in sterile field.  A low-transverse skin incision was made with a knife, carried through subcutaneous tissue.  Fascia was entered sharp and incision fashioned laterally.  Rectus muscles were separated in midline.  Peritoneum was entered sharply and incision of peritoneum was extended both superiorly and inferiorly.  A low-transverse uterine incision begun with knife and extended laterally using manual traction. The infant presented in the vertex presentation, delivered with elevation of head and fundal pressure.  The infant was a viable female. Apgars were 6/9, pH is pending.  Placenta was delivered manually and sent to Pathology.  Uterus was exteriorized for closure.  There was no evidence of any cord integument to explain the bradycardia.  Uterus was closed with running locking suture of 0 chromic using a 2-layer closure technique.  We had good approximation of hemostasis and clear urine  output.  Uterus returned to abdominal cavity, irrigated the pelvis, had good hemostasis.  Muscles and peritoneum closed with running suture of 3- 0 Vicryl.  Fascia was closed with running suture of 0 PDS.  Skin was closed with staples and Steri-Strips.  Because of the urgent nature of dissection, count was not obtained.  We are going to do an x-ray to make sure there is no retained sponges or instruments.  Once this is done, the patient will be transferred to recovery room.  She did tolerate the procedure well.     Juluis Mire, M.D.     JSM/MEDQ  D:  08/07/2012  T:  08/07/2012  Job:  [id number]

## 2012-08-07 NOTE — Progress Notes (Signed)
Patient ID: Stacey Dodson, female   DOB: 23-Jun-1976, 36 y.o.   MRN: [id number] episode of bradycardia to 80's slowly came up to 90's  Cervix rim reduced with pushing However fhr dropped to 70's despite position changes and oxygen vtx not descending well with pushing  Will precede with cesarean section.Risk of cesarean section discussed.  These include:  Risk of infection;  Risk of hemorrhage that could require transfusions with the associated risk of aids or hepatitis;  Excessive bleeding could require hysterectomy;  Risk of injury to adjacent organs including bladder, bowel or ureters;  Risk of DVT's and possible pulmonary embolus.  Patient expresses a understanding of indications and risks.;

## 2012-08-07 NOTE — OR Nursing (Signed)
100 ml blood loss during fundal massage by DLWegner RN, cord blood x 2 to OR front desk

## 2012-08-07 NOTE — Anesthesia Postprocedure Evaluation (Signed)
Anesthesia Post Note  Patient: Stacey Dodson  Procedure(s) Performed: Procedure(s) (LRB): CESAREAN SECTION (N/A)  Anesthesia type: Epidural  Patient location: PACU  Post pain: Pain level controlled  Post assessment: Post-op Vital signs reviewed  Last Vitals:  Filed Vitals:   08/07/12 0615  BP: 100/60  Pulse: 90  Temp: 36.9 C  Resp: 17    Post vital signs: stable  Level of consciousness: awake  Complications: No apparent anesthesia complications

## 2012-08-07 NOTE — Anesthesia Postprocedure Evaluation (Signed)
  Anesthesia Post-op Note  Patient: Stacey Dodson  Procedure(s) Performed: Procedure(s) (LRB) with comments: CESAREAN SECTION (N/A) - Primary cesarean section of baby  boy at 41  APGAR 6/9  Patient Location: Mother/Baby  Anesthesia Type:Epidural  Level of Consciousness: awake  Airway and Oxygen Therapy: Patient Spontanous Breathing  Post-op Pain: none  Post-op Assessment: Patient's Cardiovascular Status Stable, Respiratory Function Stable, Patent Airway, No signs of Nausea or vomiting, Adequate PO intake, Pain level controlled, No headache, No backache, No residual numbness and No residual motor weakness  Post-op Vital Signs: Reviewed and stable  Complications: No apparent anesthesia complications

## 2012-08-07 NOTE — Op Note (Signed)
Patient name  Stacey Dodson, Stacey Dodson DICTATION#  ? CSN# [id number]  Juluis Mire, MD 08/07/2012 3:49 AM

## 2012-08-08 LAB — CBC
Hemoglobin: 9.2 g/dL — ABNORMAL LOW (ref 12.0–15.0)
Platelets: 140 10*3/uL — ABNORMAL LOW (ref 150–400)
RBC: 3.06 MIL/uL — ABNORMAL LOW (ref 3.87–5.11)

## 2012-08-08 NOTE — Progress Notes (Signed)
Subjective: Postpartum Day 1: Cesarean Delivery Patient reports incisional pain, tolerating PO, + flatus and + BM.    Objective: Vital signs in last 24 hours: Temp:  [98 F (36.7 C)-98.7 F (37.1 C)] 98.1 F (36.7 C) (12/25 0544) Pulse Rate:  [73-91] 85  (12/25 0544) Resp:  [16-18] 18  (12/25 0544) BP: (92-105)/(43-65) 92/63 mmHg (12/25 0544) SpO2:  [95 %-98 %] 97 % (12/25 0130)  Physical Exam:  General: alert, cooperative, appears stated age and mild distress Lochia: appropriate Uterine Fundus: firm Incision: healing well DVT Evaluation: No evidence of DVT seen on physical exam.   Basename 08/08/12 0500 08/06/12 1255  HGB 9.2* 12.3  HCT 27.5* 35.5*    Assessment/Plan: Status post Cesarean section. Doing well postoperatively.  Continue current care.  Toniqua Melamed C 08/08/2012, 8:39 AM

## 2012-08-09 ENCOUNTER — Telehealth (HOSPITAL_COMMUNITY): Payer: Self-pay | Admitting: *Deleted

## 2012-08-09 MED ORDER — IBUPROFEN 600 MG PO TABS: 600.0000 mg | ORAL_TABLET | Freq: Four times a day (QID) | ORAL | Status: DC | PRN

## 2012-08-09 NOTE — Progress Notes (Signed)
Subjective: Postpartum Day 2 Cesarean Delivery Patient reports incisional pain and tolerating PO.    Objective: Vital signs in last 24 hours: Temp:  [98.2 F (36.8 C)-98.8 F (37.1 C)] 98.2 F (36.8 C) (12/26 0650) Pulse Rate:  [78-92] 78  (12/26 0650) Resp:  [18-19] 18  (12/26 0650) BP: (114-120)/(68-78) 114/76 mmHg (12/26 0650)  Physical Exam:  General: alert, cooperative and appears stated age Lochia: appropriate Uterine Fundus: firm Incision: healing well, no significant drainage DVT Evaluation: No evidence of DVT seen on physical exam.   Basename 08/08/12 0500 08/06/12 1255  HGB 9.2* 12.3  HCT 27.5* 35.5*    Assessment/Plan: Status post Cesarean section. Doing well postoperatively.  Discharge home with standard precautions and return to clinic in 1 week.  Sevilla Murtagh L 08/09/2012, 8:15 AM

## 2012-08-09 NOTE — Telephone Encounter (Signed)
Resolve episode 

## 2012-08-09 NOTE — Discharge Summary (Signed)
Obstetric Discharge Summary Reason for Admission: rupture of membranes Prenatal Procedures: none Intrapartum Procedures: cesarean: low cervical, transverse Postpartum Procedures: none Complications-Operative and Postpartum: none Hemoglobin  Date Value Range Status  08/08/2012 9.2* 12.0 - 15.0 g/dL Final     REPEATED TO VERIFY     DELTA CHECK NOTED     HCT  Date Value Range Status  08/08/2012 27.5* 36.0 - 46.0 % Final    Physical Exam:  General: alert, cooperative and appears stated age 10: appropriate Uterine Fundus: firm Incision: healing well DVT Evaluation: No evidence of DVT seen on physical exam.  Discharge Diagnoses: Term Pregnancy-delivered  Discharge Information: Date: 08/09/2012 Activity: pelvic rest Diet: routine Medications: Ibuprofen Condition: stable Instructions: refer to practice specific booklet Discharge to: home   Newborn Data: Live born female  Birth Weight: 8 lb 2.7 oz (3705 g) APGAR: 6, 9  Home with mother.  Amberly Livas L 08/09/2012, 8:16 AM

## 2012-08-10 ENCOUNTER — Encounter (HOSPITAL_COMMUNITY): Payer: Self-pay | Admitting: Obstetrics and Gynecology

## 2013-10-19 IMAGING — CR DG ABD PORTABLE 1V
1 series · 1 of 1 positions shown · non-contrast
Comparison: None.

CLINICAL DATA: C-section for fetal bradycardia; sponge count.

PORTABLE ABDOMEN - 1 VIEW

[view not recorded]
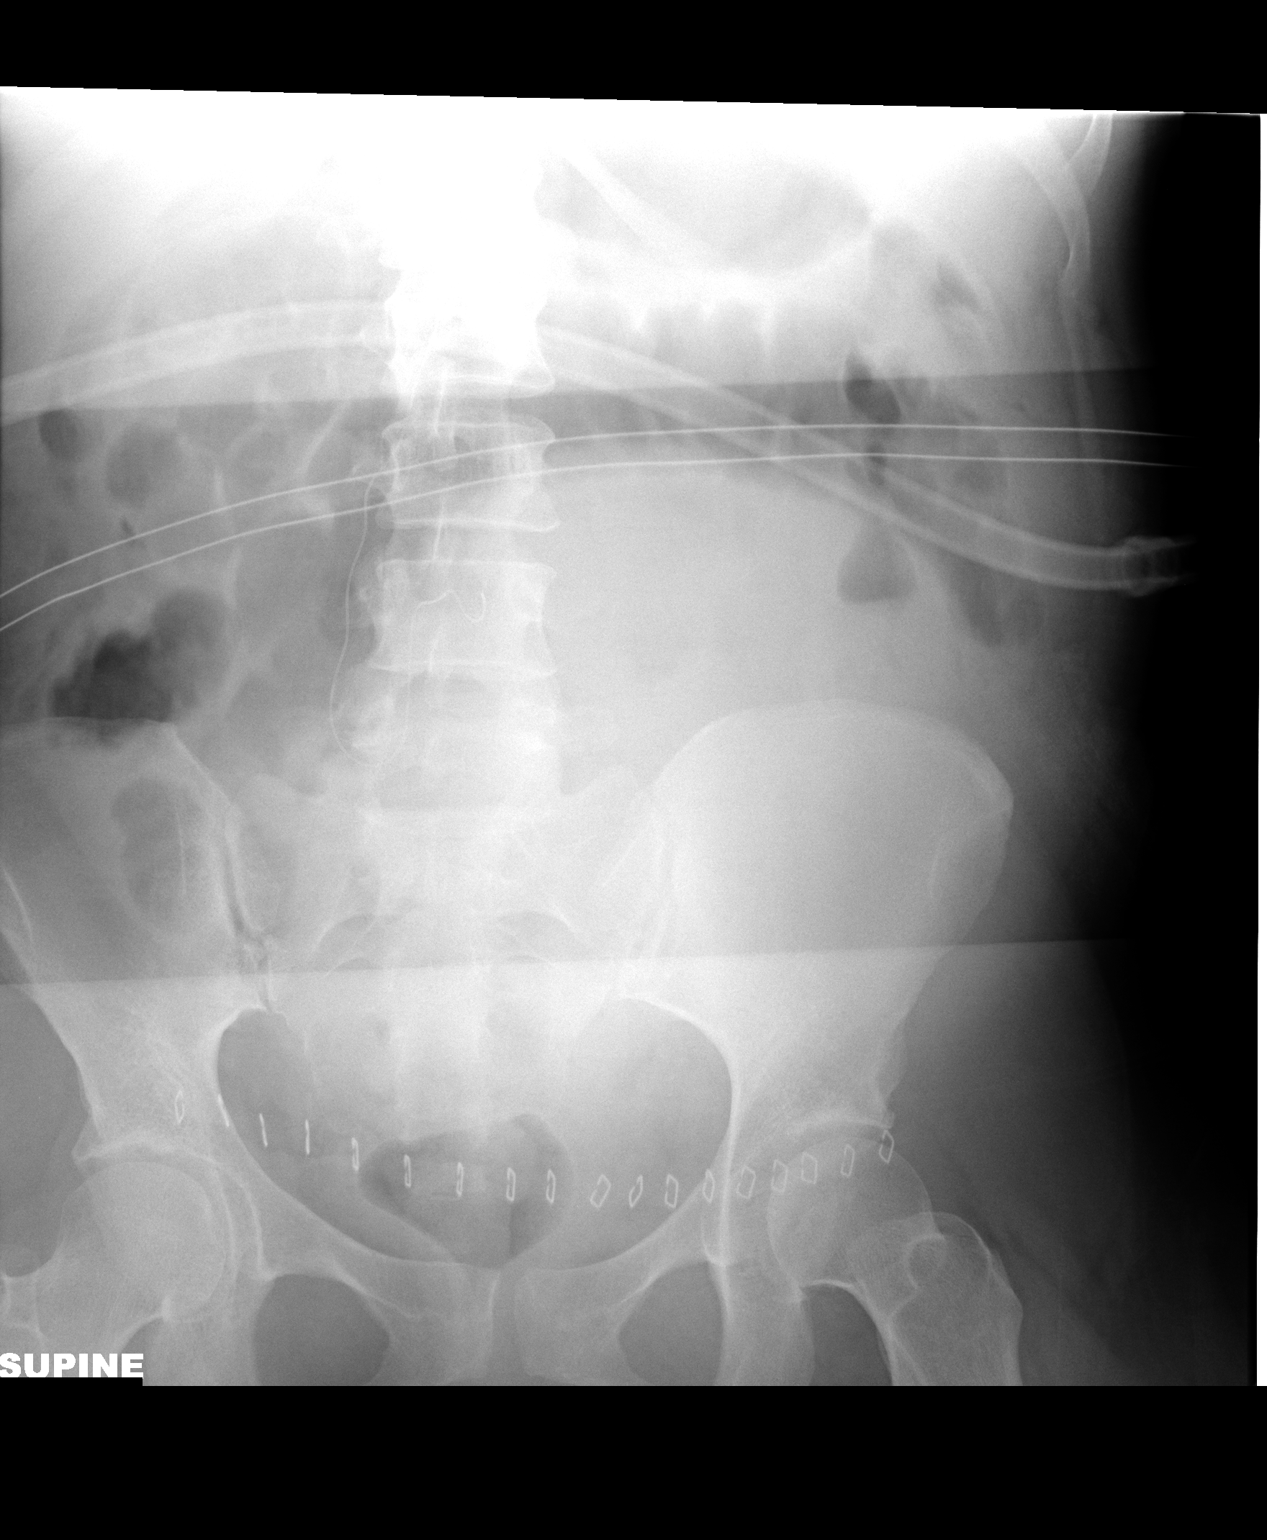

[1 of 1 positions shown; findings below may reference images not displayed]

FINDINGS: A thin curved radiopaque line is noted extending near the
midline along the lower and mid abdomen.  Per clinical correlation,
the sponges used have a shorter and thicker radiopaque stripe; this
could reflect the drape overlying the patient.

Tubes are seen overlying the midabdomen.  The visualized bowel gas
pattern is grossly unremarkable.  Skin staples are seen overlying
the pelvis.  No acute osseous abnormalities are identified.
IMPRESSION: Thin curved radiopaque line extending near the midline along the
lower and mid abdomen.  Per clinical correlation, the sponges used
have a shorter and thicker radiopaque stripe; this could simply
reflect the drape overlying the patient.

No retained instruments or definite retained sponges seen.

to Dr. Yesika Mode, who verbally acknowledged these results.

## 2014-06-16 ENCOUNTER — Encounter (HOSPITAL_COMMUNITY): Payer: Self-pay | Admitting: Obstetrics and Gynecology

## 2014-12-01 ENCOUNTER — Other Ambulatory Visit: Payer: Self-pay | Admitting: Obstetrics and Gynecology

## 2014-12-02 LAB — CYTOLOGY - PAP

## 2016-06-22 ENCOUNTER — Other Ambulatory Visit: Payer: Self-pay | Admitting: Nurse Practitioner

## 2016-06-22 DIAGNOSIS — Z1231 Encounter for screening mammogram for malignant neoplasm of breast: Secondary | ICD-10-CM

## 2016-07-21 ENCOUNTER — Ambulatory Visit: Payer: Self-pay

## 2016-07-22 ENCOUNTER — Ambulatory Visit
Admission: RE | Admit: 2016-07-22 | Discharge: 2016-07-22 | Disposition: A | Discharge status: Home / Self Care | Payer: BLUE CROSS/BLUE SHIELD | Source: Ambulatory Visit | Attending: Nurse Practitioner | Admitting: Nurse Practitioner

## 2016-07-22 DIAGNOSIS — Z1231 Encounter for screening mammogram for malignant neoplasm of breast: Secondary | ICD-10-CM

## 2016-08-10 ENCOUNTER — Encounter: Payer: Self-pay | Admitting: Nurse Practitioner

## 2016-08-10 ENCOUNTER — Ambulatory Visit (INDEPENDENT_AMBULATORY_CARE_PROVIDER_SITE_OTHER): Payer: BLUE CROSS/BLUE SHIELD | Admitting: Nurse Practitioner

## 2016-08-10 VITALS — BP 118/76 | HR 84 | Resp 16 | Ht 62.0 in | Wt 130.6 lb

## 2016-08-10 DIAGNOSIS — Z01419 Encounter for gynecological examination (general) (routine) without abnormal findings: Secondary | ICD-10-CM | POA: Diagnosis not present

## 2016-08-10 NOTE — Patient Instructions (Signed)

## 2016-08-10 NOTE — Progress Notes (Signed)
Patient ID: Stacey AntiguaSage Hanna Dodson, female   DOB: [birth year], 40 y.o.   MRN: [id number]  40 y.o. [id number] Married  Caucasian Fe here for NGYN annual exam.  Previous pt here.  She had a C section 08/07/12.  Husband had vasectomy.  Menses heavy for 2 days with cramps.  Then moderate to light for a total of 6 days. Some PMS. Cycles are 25- 32 days.  Some joint pain in toes and hands  Take OTC natural remedies with help.  Sees PCP.  Patient's last menstrual period was 08/08/2016.          Sexually active: Yes.    The current method of family planning is vasectomy.    Exercising: Yes.    yoga, pilates, walking/run Smoker:  no  Health Maintenance: Pap:  12/01/2014 - Dr. Rana SnareLowe at [facility]:  07/22/16 BIRADS 1 negative TDaP:  About 2012 HIV: 01/05/12 Labs: HB: PCP   Urine: PCP   reports that she has never smoked. She has never used smokeless tobacco. She reports that she does not drink alcohol or use drugs.  Past Medical History:  Diagnosis Date  . Abnormal Pap smear of cervix early 2000   had colpo biopsy and negative pathology - normal since  . Anemia   . No pertinent past medical history     Past Surgical History:  Procedure Laterality Date  . CESAREAN SECTION  08/07/2012   Procedure: CESAREAN SECTION;  Surgeon: Juluis MireJohn S McComb, MD;  Location: [facility];  Service: Obstetrics;  Laterality: N/A;  Primary cesarean section of baby  boy at 0320  APGAR 6/9    Current Outpatient Prescriptions  Medication Sig Dispense Refill  . Multiple Vitamin (MULTI-VITAMIN DAILY PO) Take by mouth daily.    . Probiotic Product (PROBIOTIC DAILY PO) Take by mouth daily.     No current facility-administered medications for this visit.     Family History  Problem Relation Age of Onset  . Anxiety disorder Mother   . Fibroids Mother   . Hypertension Father   . Heart Problems Maternal Grandmother   . Breast cancer Maternal Grandmother   . Colon cancer Maternal Grandfather   . Diabetes Paternal  Grandmother   . COPD Paternal Grandfather   . Emphysema Paternal Grandfather     ROS:  Pertinent items are noted in HPI.  Otherwise, a comprehensive ROS was negative.  Exam:   BP 118/76 (BP Location: Right Arm, Patient Position: Sitting, Cuff Size: Normal)   Pulse 84   Resp 16   Ht 5\' 2"  (1.575 m)   Wt 130 lb 9.6 oz (59.2 kg)   LMP 08/08/2016   BMI 23.89 kg/m  Height: 5\' 2"  (157.5 cm) Ht Readings from Last 3 Encounters:  08/10/16 5\' 2"  (1.575 m)  08/06/12 5\' 3"  (1.6 m)    General appearance: alert, cooperative and appears stated age Head: Normocephalic, without obvious abnormality, atraumatic Neck: no adenopathy, supple, symmetrical, trachea midline and thyroid normal to inspection and palpation Lungs: clear to auscultation bilaterally Breasts: normal appearance, no masses or tenderness Heart: regular rate and rhythm Abdomen: soft, non-tender; no masses,  no organomegaly Extremities: extremities normal, atraumatic, no cyanosis or edema Skin: Skin color, texture, turgor normal. No rashes or lesions Lymph nodes: Cervical, supraclavicular, and axillary nodes normal. No abnormal inguinal nodes palpated Neurologic: Grossly normal   Pelvic: External genitalia:  no lesions              Urethra:  normal appearing urethra with  no masses, tenderness or lesions              Bartholin's and Skene's: normal                 Vagina: normal appearing vagina with normal color and discharge, no lesions              Cervix: anteverted              Pap taken: No. Bimanual Exam:  Uterus:  normal size, contour, position, consistency, mobility, non-tender              Adnexa: no mass, fullness, tenderness               Rectovaginal: Confirms               Anus:  normal sphincter tone, no lesions  Chaperone present: yes  A:  Well Woman with normal   Husband vasectomy  Fairly regular menses  P:   Reviewed health and wellness pertinent to exam  Pap smear not done  Mammogram is due  12/18  Counseled with diet, exercise, adequate fluid intake, menses record and return annually or prn  An After Visit Summary was printed and given to the patient.

## 2016-08-13 NOTE — Progress Notes (Signed)
Encounter reviewed by Dr. Athony Coppa Amundson C. Silva.  

## 2017-03-13 ENCOUNTER — Telehealth: Payer: Self-pay | Admitting: Obstetrics & Gynecology

## 2017-03-13 NOTE — Telephone Encounter (Signed)
LMTCB/:NP/ .CX/LETTER SENT/RD ° °

## 2017-07-21 ENCOUNTER — Ambulatory Visit: Payer: BLUE CROSS/BLUE SHIELD | Admitting: Certified Nurse Midwife

## 2017-07-21 ENCOUNTER — Encounter: Payer: Self-pay | Admitting: Certified Nurse Midwife

## 2017-07-21 ENCOUNTER — Other Ambulatory Visit: Payer: Self-pay

## 2017-07-21 VITALS — BP 100/62 | HR 60 | Resp 16 | Ht 62.25 in | Wt 123.0 lb

## 2017-07-21 DIAGNOSIS — Z01419 Encounter for gynecological examination (general) (routine) without abnormal findings: Secondary | ICD-10-CM | POA: Diagnosis not present

## 2017-07-21 NOTE — Patient Instructions (Signed)

## 2017-07-21 NOTE — Progress Notes (Signed)
41 y.o. [id number] Married  Caucasian Fe here for annual exam. Periods normal, with heavy days 1-2 and then light. No concerns.  Sees Maryelizabeth RowanElizabeth Dewey yearly and labs which were all normal. No health issues today.  Patient's last menstrual period was 07/02/2017 (exact date).          Sexually active: Yes.    The current method of family planning is vasectomy.    Exercising: Yes.    walking, yoga & pilates Smoker:  no  Health Maintenance: Pap:  12-01-14 neg History of Abnormal Pap: yes MMG:  07-22-16 category c density birads 1:neg, schedule for next month Self Breast exams: yes Colonoscopy:  none BMD:   none TDaP:  2012 Shingles: no Pneumonia: no Hep C and HIV: not done Labs: if needed   reports that  has never smoked. she has never used smokeless tobacco. She reports that she drinks about 1.8 oz of alcohol per week. She reports that she does not use drugs.  Past Medical History:  Diagnosis Date  . Abnormal Pap smear of cervix early 2000   had colpo biopsy and negative pathology - normal since  . Anemia   . No pertinent past medical history     Past Surgical History:  Procedure Laterality Date  . CESAREAN SECTION  08/07/2012   Procedure: CESAREAN SECTION;  Surgeon: Juluis MireJohn S McComb, MD;  Location: [facility];  Service: Obstetrics;  Laterality: N/A;  Primary cesarean section of baby  boy at 0320  APGAR 6/9    Current Outpatient Medications  Medication Sig Dispense Refill  . Multiple Vitamin (MULTI-VITAMIN DAILY PO) Take by mouth daily.    . Probiotic Product (PROBIOTIC DAILY PO) Take by mouth daily.     No current facility-administered medications for this visit.     Family History  Problem Relation Age of Onset  . Anxiety disorder Mother   . Fibroids Mother   . Hypertension Father   . Heart Problems Maternal Grandmother   . Breast cancer Maternal Grandmother   . Colon cancer Maternal Grandfather   . Diabetes Paternal Grandmother   . COPD Paternal Grandfather   . Emphysema  Paternal Grandfather     ROS:  Pertinent items are noted in HPI.  Otherwise, a comprehensive ROS was negative.  Exam:   BP 100/62   Pulse 60   Resp 16   Ht 5' 2.25" (1.581 m)   Wt 123 lb (55.8 kg)   LMP 07/02/2017 (Exact Date)   BMI 22.32 kg/m  Height: 5' 2.25" (158.1 cm) Ht Readings from Last 3 Encounters:  07/21/17 5' 2.25" (1.581 m)  08/10/16 5\' 2"  (1.575 m)  08/06/12 5\' 3"  (1.6 m)    General appearance: alert, cooperative and appears stated age Head: Normocephalic, without obvious abnormality, atraumatic Neck: no adenopathy, supple, symmetrical, trachea midline and thyroid normal to inspection and palpation Lungs: clear to auscultation bilaterally Breasts: normal appearance, no masses or tenderness, No nipple retraction or dimpling, No nipple discharge or bleeding, No axillary or supraclavicular adenopathy Heart: regular rate and rhythm Abdomen: soft, non-tender; no masses,  no organomegaly Extremities: extremities normal, atraumatic, no cyanosis or edema Skin: Skin color, texture, turgor normal. No rashes or lesions Lymph nodes: Cervical, supraclavicular, and axillary nodes normal. No abnormal inguinal nodes palpated Neurologic: Grossly normal   Pelvic: External genitalia:  no lesions              Urethra:  normal appearing urethra with no masses, tenderness or lesions  Bartholin's and Skene's: normal                 Vagina: normal appearing vagina with normal color and discharge, no lesions              Cervix: no cervical motion tenderness, no lesions and normal appearance              Pap taken: Yes.   Bimanual Exam:  Uterus:  normal size, contour, position, consistency, mobility, non-tender              Adnexa: normal adnexa and no mass, fullness, tenderness               Rectovaginal: Confirms               Anus:  normal sphincter tone, no lesions  Chaperone present: yes  A:  Well Woman with normal exam  Contraception Spouse vasectomy  P:    Reviewed health and wellness pertinent to exam  Pap smear: no repeat next year   counseled on breast self exam, mammography screening, adequate intake of calcium and vitamin D, diet and exercise  return annually or prn  An After Visit Summary was printed and given to the patient.

## 2017-08-14 ENCOUNTER — Ambulatory Visit: Payer: BLUE CROSS/BLUE SHIELD | Admitting: Nurse Practitioner

## 2017-08-17 ENCOUNTER — Other Ambulatory Visit: Payer: Self-pay | Admitting: Certified Nurse Midwife

## 2017-08-17 DIAGNOSIS — Z1231 Encounter for screening mammogram for malignant neoplasm of breast: Secondary | ICD-10-CM

## 2017-08-18 ENCOUNTER — Ambulatory Visit
Admission: RE | Admit: 2017-08-18 | Discharge: 2017-08-18 | Disposition: A | Discharge status: Home / Self Care | Payer: BLUE CROSS/BLUE SHIELD | Source: Ambulatory Visit | Attending: Certified Nurse Midwife | Admitting: Certified Nurse Midwife

## 2017-08-18 DIAGNOSIS — Z1231 Encounter for screening mammogram for malignant neoplasm of breast: Secondary | ICD-10-CM

## 2018-08-01 ENCOUNTER — Ambulatory Visit: Payer: BLUE CROSS/BLUE SHIELD | Admitting: Certified Nurse Midwife

## 2018-08-01 ENCOUNTER — Encounter: Payer: Self-pay | Admitting: Certified Nurse Midwife

## 2018-08-01 ENCOUNTER — Other Ambulatory Visit (HOSPITAL_COMMUNITY)
Admission: RE | Admit: 2018-08-01 | Discharge: 2018-08-01 | Disposition: A | Discharge status: Home / Self Care | Payer: BLUE CROSS/BLUE SHIELD | Source: Ambulatory Visit | Attending: Obstetrics & Gynecology | Admitting: Obstetrics & Gynecology

## 2018-08-01 ENCOUNTER — Other Ambulatory Visit: Payer: Self-pay

## 2018-08-01 VITALS — BP 104/70 | HR 68 | Resp 16 | Ht 62.25 in | Wt 126.0 lb

## 2018-08-01 DIAGNOSIS — Z124 Encounter for screening for malignant neoplasm of cervix: Secondary | ICD-10-CM | POA: Diagnosis not present

## 2018-08-01 DIAGNOSIS — Z803 Family history of malignant neoplasm of breast: Secondary | ICD-10-CM

## 2018-08-01 DIAGNOSIS — Z01419 Encounter for gynecological examination (general) (routine) without abnormal findings: Secondary | ICD-10-CM | POA: Diagnosis not present

## 2018-08-01 NOTE — Progress Notes (Signed)
42 y.o. [id number] Married  Caucasian Fe here for annual exam. Periods normal, no issues.  Seeing Dr. Duanne Guessewey for aex and labs. Continues exercise with good result. Will be changing insurance, not sure if she will here for aex, will advise next year. Hemorrhoids flare occasional, but no black tarry stools or blood in stools. Uses OTC medication and diet change as needed. No other health issues today.  Patient's last menstrual period was 07/27/2018 (exact date).          Sexually active: Yes.    The current method of family planning is vasectomy.    Exercising: Yes.    walking, biking, yoga, pilates Smoker:  no  Review of Systems  Constitutional: Negative.   HENT: Negative.   Eyes: Negative.   Respiratory: Negative.   Cardiovascular: Negative.   Gastrointestinal:       Hemorrhoids  Genitourinary: Negative.   Musculoskeletal: Negative.   Skin: Negative.   Neurological: Negative.   Endo/Heme/Allergies: Negative.   Psychiatric/Behavioral: Negative.     Health Maintenance: Pap:  12-01-14 neg History of Abnormal Pap: yes MMG:  08-18-17 category c density birads 1:neg Self Breast exams: yes Colonoscopy:  none BMD:   none TDaP:  2019 Shingles: no Pneumonia: no Hep C and HIV: neg per patient Labs: if needed   reports that she has never smoked. She has never used smokeless tobacco. She reports current alcohol use of about 2.0 - 3.0 standard drinks of alcohol per week. She reports that she does not use drugs.  Past Medical History:  Diagnosis Date  . Abnormal Pap smear of cervix early 2000   had colpo biopsy and negative pathology - normal since  . Anemia   . No pertinent past medical history     Past Surgical History:  Procedure Laterality Date  . CESAREAN SECTION  08/07/2012   Procedure: CESAREAN SECTION;  Surgeon: Juluis MireJohn S McComb, MD;  Location: [facility];  Service: Obstetrics;  Laterality: N/A;  Primary cesarean section of baby  boy at 0320  APGAR 6/9    Current Outpatient  Medications  Medication Sig Dispense Refill  . ECHINACEA PO Take by mouth.    . Probiotic Product (PROBIOTIC DAILY PO) Take by mouth daily.    . Multiple Vitamin (MULTI-VITAMIN DAILY PO) Take by mouth daily.     No current facility-administered medications for this visit.     Family History  Problem Relation Age of Onset  . Anxiety disorder Mother   . Fibroids Mother   . Hypertension Father   . Heart Problems Maternal Grandmother   . Breast cancer Maternal Grandmother   . Colon cancer Maternal Grandfather   . Diabetes Paternal Grandmother   . COPD Paternal Grandfather   . Emphysema Paternal Grandfather     ROS:  Pertinent items are noted in HPI.  Otherwise, a comprehensive ROS was negative.  Exam:   BP 104/70   Pulse 68   Resp 16   Ht 5' 2.25" (1.581 m)   Wt 126 lb (57.2 kg)   LMP 07/27/2018 (Exact Date)   BMI 22.86 kg/m  Height: 5' 2.25" (158.1 cm) Ht Readings from Last 3 Encounters:  08/01/18 5' 2.25" (1.581 m)  07/21/17 5' 2.25" (1.581 m)  08/10/16 5\' 2"  (1.575 m)    General appearance: alert, cooperative and appears stated age Head: Normocephalic, without obvious abnormality, atraumatic Neck: no adenopathy, supple, symmetrical, trachea midline and thyroid normal to inspection and palpation Lungs: clear to auscultation bilaterally Breasts: normal appearance, no  masses or tenderness, No nipple retraction or dimpling, No nipple discharge or bleeding, No axillary or supraclavicular adenopathy Heart: regular rate and rhythm Abdomen: soft, non-tender; no masses,  no organomegaly Extremities: extremities normal, atraumatic, no cyanosis or edema Skin: Skin color, texture, turgor normal. No rashes or lesions Lymph nodes: Cervical, supraclavicular, and axillary nodes normal. No abnormal inguinal nodes palpated Neurologic: Grossly normal   Pelvic: External genitalia:  no lesions, normal female              Urethra:  normal appearing urethra with no masses, tenderness or  lesions              Bartholin's and Skene's: normal                 Vagina: normal appearing vagina with normal color and discharge, no lesions              Cervix: no cervical motion tenderness, no lesions and normal appearance              Pap taken: Yes.   Bimanual Exam:  Uterus:  normal size, contour, position, consistency, mobility, non-tender and anteverted              Adnexa: normal adnexa and no mass, fullness, tenderness               Rectovaginal: Confirms               Anus:  normal sphincter tone, no lesions  Chaperone present: yes  A:  Well Woman with normal exam  Contraception spouse vasectomy  Hemorrhoid flare uses OTC and diet with good results  Family history of colon cancer MGF late age  Family history of breast cancer MGM  P:   Reviewed health and wellness pertinent to exam  Discussed Colace use when hemorrhoids are flaring to prevent hard stools. Increase water intake also. Warning signs given. Patient to discuss with PCP colonoscopy recommendation with PGF and can be referred if indicated at this age.  Stressed mammogram importance and SBE with family history.  Pap smear: yes   counseled on breast self exam, mammography screening, feminine hygiene, adequate intake of calcium and vitamin D, diet and exercise  return annually or prn  An After Visit Summary was printed and given to the patient.

## 2018-08-01 NOTE — Patient Instructions (Signed)

## 2018-08-06 LAB — CYTOLOGY - PAP
Diagnosis: NEGATIVE
HPV: NOT DETECTED

## 2019-11-01 ENCOUNTER — Encounter: Payer: Self-pay | Admitting: Certified Nurse Midwife

## 2022-11-28 ENCOUNTER — Other Ambulatory Visit: Payer: Self-pay | Admitting: Orthopedic Surgery

## 2022-11-28 DIAGNOSIS — M25552 Pain in left hip: Secondary | ICD-10-CM

## 2022-12-21 ENCOUNTER — Ambulatory Visit
Admission: RE | Admit: 2022-12-21 | Discharge: 2022-12-21 | Disposition: A | Discharge status: Home / Self Care | Payer: No Typology Code available for payment source | Source: Ambulatory Visit | Attending: Orthopedic Surgery | Admitting: Orthopedic Surgery

## 2022-12-21 ENCOUNTER — Other Ambulatory Visit: Payer: BLUE CROSS/BLUE SHIELD

## 2022-12-21 DIAGNOSIS — M25552 Pain in left hip: Secondary | ICD-10-CM

## 2022-12-21 MED ORDER — IOPAMIDOL (ISOVUE-M 200) INJECTION 41%
12.0000 mL | Freq: Once | INTRAMUSCULAR | Status: AC
  Administered 2022-12-21: 12 mL via INTRA_ARTICULAR
